# Patient Record
Sex: Male | Born: 1980 | Race: Black or African American | Hispanic: No | Marital: Married | Smoking: Never smoker
Health system: Southern US, Community
[De-identification: ages and names within clinical notes are randomized; demographics above are authoritative.]

## PROBLEM LIST (undated history)

## (undated) DIAGNOSIS — N200 Calculus of kidney: Secondary | ICD-10-CM

## (undated) DIAGNOSIS — T7840XA Allergy, unspecified, initial encounter: Secondary | ICD-10-CM

## (undated) HISTORY — DX: Allergy, unspecified, initial encounter: T78.40XA

---

## 2019-12-17 ENCOUNTER — Other Ambulatory Visit: Payer: Self-pay

## 2019-12-17 ENCOUNTER — Encounter: Payer: Self-pay | Admitting: Emergency Medicine

## 2019-12-17 ENCOUNTER — Ambulatory Visit
Admission: EM | Admit: 2019-12-17 | Discharge: 2019-12-17 | Disposition: A | Discharge status: Home / Self Care | Payer: 59 | Attending: Emergency Medicine | Admitting: Emergency Medicine

## 2019-12-17 DIAGNOSIS — H00015 Hordeolum externum left lower eyelid: Secondary | ICD-10-CM

## 2019-12-17 MED ORDER — OFLOXACIN 0.3 % OP SOLN: 1.0000 [drp] | Freq: Four times a day (QID) | OPHTHALMIC | 0 refills | Status: DC

## 2019-12-17 NOTE — ED Provider Notes (Signed)
[facility]   [id number] 12/17/19 Arrival Time: 1133  CC: Red eye  SUBJECTIVE:  Joseph Lambert is a 39 y.o. male who presented to the urgent care for complaint of started to left eye.  Denies a precipitating event, trauma, or close contacts with similar symptoms.  Has tried OTC eye drops without relief.  Denies alleviating or aggravating factors.  Denies similar symptoms in the past.  Denies fever, chills, nausea, vomiting, eye pain, painful eye movements, halos, discharge, itching, vision changes, double vision, FB sensation, periorbital erythema.     Report contact lens use.    ROS: As per HPI.  All other pertinent ROS negative.     History reviewed. No pertinent past medical history. History reviewed. No pertinent surgical history. Not on File No current facility-administered medications on file prior to encounter.   No current outpatient medications on file prior to encounter.   Social History   Socioeconomic History  . Marital status: Single    Spouse name: Not on file  . Number of children: Not on file  . Years of education: Not on file  . Highest education level: Not on file  Occupational History  . Not on file  Tobacco Use  . Smoking status: Not on file  Substance and Sexual Activity  . Alcohol use: Not on file  . Drug use: Not on file  . Sexual activity: Not on file  Other Topics Concern  . Not on file  Social History Narrative  . Not on file   Social Determinants of Health   Financial Resource Strain:   . Difficulty of Paying Living Expenses: Not on file  Food Insecurity:   . Worried About Programme researcher, broadcasting/film/video in the Last Year: Not on file  . Ran Out of Food in the Last Year: Not on file  Transportation Needs:   . Lack of Transportation (Medical): Not on file  . Lack of Transportation (Non-Medical): Not on file  Physical Activity:   . Days of Exercise per Week: Not on file  . Minutes of Exercise per Session: Not on file  Stress:   . Feeling  of Stress : Not on file  Social Connections:   . Frequency of Communication with Friends and Family: Not on file  . Frequency of Social Gatherings with Friends and Family: Not on file  . Attends Religious Services: Not on file  . Active Member of Clubs or Organizations: Not on file  . Attends Banker Meetings: Not on file  . Marital Status: Not on file  Intimate Partner Violence:   . Fear of Current or Ex-Partner: Not on file  . Emotionally Abused: Not on file  . Physically Abused: Not on file  . Sexually Abused: Not on file   History reviewed. No pertinent family history.  OBJECTIVE:    Visual Acuity  Right Eye Distance:   Left Eye Distance:   Bilateral Distance:    Right Eye Near:   Left Eye Near:    Bilateral Near:      Vitals:   12/17/19 1206  BP: (!) 154/106  Pulse: 79  Resp: 16  Temp: 98.3 F (36.8 C)  TempSrc: Oral  SpO2: 96%    Physical Exam Vitals and nursing note reviewed.  Constitutional:      General: He is not in acute distress.    Appearance: Normal appearance. He is normal weight. He is not ill-appearing, toxic-appearing or diaphoretic.  Eyes:     General: Lids are  normal. Lids are everted, no foreign bodies appreciated. Vision grossly intact. Gaze aligned appropriately. No visual field deficit.       Right eye: No foreign body, discharge or hordeolum.        Left eye: Hordeolum present.No foreign body or discharge.     Comments: Stye  to left lower eyelid  Cardiovascular:     Rate and Rhythm: Normal rate and regular rhythm.     Pulses: Normal pulses.     Heart sounds: Normal heart sounds. No murmur heard.  No friction rub. No gallop.   Pulmonary:     Effort: Pulmonary effort is normal. No respiratory distress.     Breath sounds: Normal breath sounds. No stridor. No wheezing, rhonchi or rales.  Chest:     Chest wall: No tenderness.  Neurological:     Mental Status: He is alert and oriented to person, place, and time.        ASSESSMENT & PLAN:  1. Hordeolum externum of left lower eyelid     Meds ordered this encounter  Medications  . ofloxacin (OCUFLOX) 0.3 % ophthalmic solution    Sig: Place 1 drop into the left eye 4 (four) times daily.    Dispense:  5 mL    Refill:  0     Discharge Instructions  Continue warm compresses at home.  Soak a wash cloth in warm (not scalding) water and place it over the eyes. As the wash cloth cools, it should be rewarmed and replaced for a total of 5 to 10 minutes of soaking time. Warm compresses should be applied two to four times a day as long as the patient has symptoms Perform lid washing: Either warm water or very dilute baby shampoo can be placed on a clean wash cloth, gauze pad, or cotton swab. Then be advised to gently clean along the lashes and lid margin to remove the accumulated material with care to avoid contacting the ocular surface. If shampoo is used, thorough rinsing is recommended. Vigorous washing should be avoided, as it may cause more irritation.  Prescribed ofloxacin Follow up with ophthalmology for further evaluation and management if symptoms persists Return or go to ER if you have any new or worsening symptoms such as fever, chills, redness, swelling, eye pain, painful eye movements, vision changes, etc...  Reviewed expectations re: course of current medical issues. Questions answered. Outlined signs and symptoms indicating need for more acute intervention. Patient verbalized understanding. After Visit Summary given.     Note: This document was prepared using Dragon voice recognition software and may include unintentional dictation errors.    Durward Parcel, FNP 12/17/19 1219

## 2019-12-17 NOTE — ED Triage Notes (Signed)
Triaged by provider  

## 2019-12-17 NOTE — Discharge Instructions (Addendum)
Continue warm compresses at home.  Soak a wash cloth in warm (not scalding) water and place it over the eyes. As the wash cloth cools, it should be rewarmed and replaced for a total of 5 to 10 minutes of soaking time. Warm compresses should be applied two to four times a day as long as the patient has symptoms Perform lid washing: Either warm water or very dilute baby shampoo can be placed on a clean wash cloth, gauze pad, or cotton swab. Then be advised to gently clean along the lashes and lid margin to remove the accumulated material with care to avoid contacting the ocular surface. If shampoo is used, thorough rinsing is recommended. Vigorous washing should be avoided, as it may cause more irritation.  Prescribed ofloxacin Follow up with ophthalmology for further evaluation and management if symptoms persists Return or go to ER if you have any new or worsening symptoms such as fever, chills, redness, swelling, eye pain, painful eye movements, vision changes, etc... 

## 2020-02-08 ENCOUNTER — Encounter (HOSPITAL_COMMUNITY): Payer: Self-pay | Admitting: *Deleted

## 2020-02-08 ENCOUNTER — Other Ambulatory Visit: Payer: Self-pay

## 2020-02-08 DIAGNOSIS — N179 Acute kidney failure, unspecified: Secondary | ICD-10-CM | POA: Diagnosis not present

## 2020-02-08 DIAGNOSIS — Z87442 Personal history of urinary calculi: Secondary | ICD-10-CM | POA: Insufficient documentation

## 2020-02-08 DIAGNOSIS — R109 Unspecified abdominal pain: Secondary | ICD-10-CM | POA: Diagnosis present

## 2020-02-08 NOTE — ED Triage Notes (Signed)
Pt with left flank pain that started today, denies N/V or blood in urine. Hx of kidney stones 10 + yrs ago.

## 2020-02-09 ENCOUNTER — Emergency Department (HOSPITAL_COMMUNITY): Payer: 59

## 2020-02-09 ENCOUNTER — Emergency Department (HOSPITAL_COMMUNITY)
Admission: EM | Admit: 2020-02-09 | Discharge: 2020-02-09 | Disposition: A | Discharge status: Home / Self Care | Payer: 59 | Attending: Emergency Medicine | Admitting: Emergency Medicine

## 2020-02-09 DIAGNOSIS — N179 Acute kidney failure, unspecified: Secondary | ICD-10-CM

## 2020-02-09 DIAGNOSIS — R109 Unspecified abdominal pain: Secondary | ICD-10-CM

## 2020-02-09 HISTORY — DX: Calculus of kidney: N20.0

## 2020-02-09 LAB — URINALYSIS, ROUTINE W REFLEX MICROSCOPIC
Bilirubin Urine: NEGATIVE
Glucose, UA: NEGATIVE mg/dL
Ketones, ur: NEGATIVE mg/dL
Leukocytes,Ua: NEGATIVE
Nitrite: NEGATIVE
Protein, ur: NEGATIVE mg/dL
Specific Gravity, Urine: 1.025 (ref 1.005–1.030)
pH: 6.5 (ref 5.0–8.0)

## 2020-02-09 LAB — BASIC METABOLIC PANEL
Anion gap: 6 (ref 5–15)
BUN: 17 mg/dL (ref 6–20)
CO2: 26 mmol/L (ref 22–32)
Calcium: 9.2 mg/dL (ref 8.9–10.3)
Chloride: 106 mmol/L (ref 98–111)
Creatinine, Ser: 1.42 mg/dL — ABNORMAL HIGH (ref 0.61–1.24)
GFR, Estimated: >60 mL/min (ref >60)
Glucose, Bld: 105 mg/dL — ABNORMAL HIGH (ref 70–99)
Potassium: 3.9 mmol/L (ref 3.5–5.1)
Sodium: 138 mmol/L (ref 135–145)

## 2020-02-09 LAB — URINALYSIS, MICROSCOPIC (REFLEX)

## 2020-02-09 LAB — CBC WITH DIFFERENTIAL/PLATELET
Abs Immature Granulocytes: 0.04 10*3/uL (ref 0.00–0.07)
Basophils Absolute: 0.1 10*3/uL (ref 0.0–0.1)
Basophils Relative: 1 %
Eosinophils Absolute: 0.3 10*3/uL (ref 0.0–0.5)
Eosinophils Relative: 2 %
HCT: 49.8 % (ref 39.0–52.0)
Hemoglobin: 15.6 g/dL (ref 13.0–17.0)
Immature Granulocytes: 0 %
Lymphocytes Relative: 25 %
Lymphs Abs: 2.9 10*3/uL (ref 0.7–4.0)
MCH: 26.3 pg (ref 26.0–34.0)
MCHC: 31.3 g/dL (ref 30.0–36.0)
MCV: 84 fL (ref 80.0–100.0)
Monocytes Absolute: 1.5 10*3/uL — ABNORMAL HIGH (ref 0.1–1.0)
Monocytes Relative: 13 %
Neutro Abs: 6.9 10*3/uL (ref 1.7–7.7)
Neutrophils Relative %: 59 %
Platelets: 320 10*3/uL (ref 150–400)
RBC: 5.93 MIL/uL — ABNORMAL HIGH (ref 4.22–5.81)
RDW: 14.3 % (ref 11.5–15.5)
WBC: 11.7 10*3/uL — ABNORMAL HIGH (ref 4.0–10.5)
nRBC: 0 % (ref 0.0–0.2)

## 2020-02-09 MED ORDER — KETOROLAC TROMETHAMINE 30 MG/ML IJ SOLN
30.0000 mg | Freq: Once | INTRAMUSCULAR | Status: AC
  Administered 2020-02-09: 30 mg via INTRAMUSCULAR
  Filled 2020-02-09: qty 1
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
## 2020-02-09 NOTE — ED Notes (Signed)
Patient transported to CT 

## 2020-02-09 NOTE — Discharge Instructions (Signed)
You were seen in the emergency department for evaluation of left flank pain.  You had blood work and a urinalysis that did not show an obvious explanation for your pain.  Your kidney function was slightly elevated and you should drink plenty of fluids.  A CT showed some inflammation around the left kidney and the tube going to the bladder.  This may reflect a recently passed kidney stone.  Return to the emergency department if any worsening or concerning symptoms.

## 2020-08-20 ENCOUNTER — Other Ambulatory Visit: Payer: Self-pay

## 2020-08-20 ENCOUNTER — Encounter (HOSPITAL_COMMUNITY): Payer: Self-pay | Admitting: *Deleted

## 2020-08-20 ENCOUNTER — Emergency Department (HOSPITAL_COMMUNITY): Payer: 59

## 2020-08-20 ENCOUNTER — Emergency Department (HOSPITAL_COMMUNITY)
Admission: EM | Admit: 2020-08-20 | Discharge: 2020-08-20 | Disposition: A | Discharge status: Home / Self Care | Payer: 59 | Attending: Emergency Medicine | Admitting: Emergency Medicine

## 2020-08-20 DIAGNOSIS — N200 Calculus of kidney: Secondary | ICD-10-CM

## 2020-08-20 DIAGNOSIS — R1032 Left lower quadrant pain: Secondary | ICD-10-CM | POA: Diagnosis present

## 2020-08-20 DIAGNOSIS — K5900 Constipation, unspecified: Secondary | ICD-10-CM | POA: Insufficient documentation

## 2020-08-20 DIAGNOSIS — R197 Diarrhea, unspecified: Secondary | ICD-10-CM | POA: Insufficient documentation

## 2020-08-20 LAB — CBC WITH DIFFERENTIAL/PLATELET
Abs Immature Granulocytes: 0.04 10*3/uL (ref 0.00–0.07)
Basophils Absolute: 0.1 10*3/uL (ref 0.0–0.1)
Basophils Relative: 1 %
Eosinophils Absolute: 0.1 10*3/uL (ref 0.0–0.5)
Eosinophils Relative: 1 %
HCT: 48.1 % (ref 39.0–52.0)
Hemoglobin: 15.2 g/dL (ref 13.0–17.0)
Immature Granulocytes: 0 %
Lymphocytes Relative: 19 %
Lymphs Abs: 1.8 10*3/uL (ref 0.7–4.0)
MCH: 26.3 pg (ref 26.0–34.0)
MCHC: 31.6 g/dL (ref 30.0–36.0)
MCV: 83.4 fL (ref 80.0–100.0)
Monocytes Absolute: 1 10*3/uL (ref 0.1–1.0)
Monocytes Relative: 11 %
Neutro Abs: 6.2 10*3/uL (ref 1.7–7.7)
Neutrophils Relative %: 68 %
Platelets: 307 10*3/uL (ref 150–400)
RBC: 5.77 MIL/uL (ref 4.22–5.81)
RDW: 14.3 % (ref 11.5–15.5)
WBC: 9.1 10*3/uL (ref 4.0–10.5)
nRBC: 0 % (ref 0.0–0.2)

## 2020-08-20 LAB — COMPREHENSIVE METABOLIC PANEL
ALT: 35 U/L (ref 0–44)
AST: 22 U/L (ref 15–41)
Albumin: 4 g/dL (ref 3.5–5.0)
Alkaline Phosphatase: 72 U/L (ref 38–126)
Anion gap: 6 (ref 5–15)
BUN: 14 mg/dL (ref 6–20)
CO2: 28 mmol/L (ref 22–32)
Calcium: 9.2 mg/dL (ref 8.9–10.3)
Chloride: 104 mmol/L (ref 98–111)
Creatinine, Ser: 1.69 mg/dL — ABNORMAL HIGH (ref 0.61–1.24)
GFR, Estimated: 53 mL/min — ABNORMAL LOW (ref >60)
Glucose, Bld: 95 mg/dL (ref 70–99)
Potassium: 4 mmol/L (ref 3.5–5.1)
Sodium: 138 mmol/L (ref 135–145)
Total Bilirubin: 0.4 mg/dL (ref 0.3–1.2)
Total Protein: 7.4 g/dL (ref 6.5–8.1)

## 2020-08-20 LAB — URINALYSIS, ROUTINE W REFLEX MICROSCOPIC
Bilirubin Urine: NEGATIVE
Glucose, UA: NEGATIVE mg/dL
Hgb urine dipstick: NEGATIVE
Ketones, ur: NEGATIVE mg/dL
Leukocytes,Ua: NEGATIVE
Nitrite: NEGATIVE
Protein, ur: NEGATIVE mg/dL
Specific Gravity, Urine: 1.021 (ref 1.005–1.030)
pH: 7 (ref 5.0–8.0)

## 2020-08-20 LAB — LIPASE, BLOOD: Lipase: 22 U/L (ref 11–51)

## 2020-08-20 MED ORDER — MORPHINE SULFATE (PF) 4 MG/ML IV SOLN
4.0000 mg | Freq: Once | INTRAVENOUS | Status: AC
  Administered 2020-08-20: 4 mg via INTRAVENOUS
  Filled 2020-08-20: qty 1

## 2020-08-20 MED ORDER — IOHEXOL 300 MG/ML  SOLN
100.0000 mL | Freq: Once | INTRAMUSCULAR | Status: AC | PRN
  Administered 2020-08-20: 100 mL via INTRAVENOUS

## 2020-08-20 MED ORDER — ONDANSETRON HCL 4 MG PO TABS: 4.0000 mg | ORAL_TABLET | Freq: Three times a day (TID) | ORAL | 0 refills | Status: DC | PRN

## 2020-08-20 MED ORDER — OXYCODONE-ACETAMINOPHEN 5-325 MG PO TABS: 1.0000 | ORAL_TABLET | Freq: Three times a day (TID) | ORAL | 0 refills | Status: AC | PRN

## 2020-08-20 NOTE — ED Triage Notes (Signed)
Pt with abd pain x 2 hours LLQ pain Denies any N/V/D denies any fevers.

## 2020-08-20 NOTE — ED Provider Notes (Signed)
[facility] EMERGENCY DEPARTMENT Provider Note   CSN: [id number] Arrival date & time: 08/20/20  1619     History Chief Complaint  Patient presents with  . Abdominal Pain    Joseph Lambert is a 40 y.o. male.  HPI   Patient with significant medical history of kidney stones presents to the emergency department with chief complaint of left lower abdominal pain.  Patient states pain came on suddenly, describes it as a sharp sensation, does not radiate, with associated difficulty urination, denies nausea, vomiting, diarrhea, constipation, denies dysuria, hematuria, penile discharge or testicular pain.  Patient states this feels similar to when he had a kidney stone in the past, he has taken Tylenol with some relief.  States standing  makes the pain  better, denies aggravating factors.   Patient has no significant abdominal history, no history of pancreatitis, diverticulitis, bowel obstruction, stomach ulcers.  Patient denies headaches, fevers, chills, shortness of breath, chest pain, nausea, vomiting, diarrhea, worsening pedal edema.  Past Medical History:  Diagnosis Date  . Kidney stones     There are no problems to display for this patient.   History reviewed. No pertinent surgical history.     History reviewed. No pertinent family history.  Social History   Tobacco Use  . Smoking status: Never Smoker  . Smokeless tobacco: Never Used  Substance Use Topics  . Alcohol use: Yes  . Drug use: Never    Home Medications Prior to Admission medications   Medication Sig Start Date End Date Taking? Authorizing Provider  cetirizine (ZYRTEC ALLERGY) 10 MG tablet Take 10 mg by mouth daily.   Yes [provider]  ibuprofen (ADVIL) 200 MG tablet Take 600 mg by mouth every 6 (six) hours as needed.   Yes [provider]  ondansetron (ZOFRAN) 4 MG tablet Take 1 tablet (4 mg total) by mouth every 8 (eight) hours as needed for nausea or vomiting. 08/20/20  Yes Carroll Sage, PA-C  ondansetron (ZOFRAN) 4 MG tablet Take 1 tablet (4 mg total) by mouth every 8 (eight) hours as needed for nausea or vomiting. 08/20/20  Yes Carroll Sage, PA-C  oxyCODONE-acetaminophen (PERCOCET/ROXICET) 5-325 MG tablet Take 1 tablet by mouth every 8 (eight) hours as needed for up to 2 days for severe pain. 08/20/20 08/22/20 Yes Carroll Sage, PA-C  ofloxacin (OCUFLOX) 0.3 % ophthalmic solution Place 1 drop into the left eye 4 (four) times daily. Patient not taking: Reported on 08/20/2020 12/17/19   Durward Parcel, FNP    Allergies    Penicillins  Review of Systems   Review of Systems  Constitutional: Negative for chills and fever.  HENT: Negative for congestion and sore throat.   Respiratory: Negative for shortness of breath.   Cardiovascular: Negative for chest pain.  Gastrointestinal: Positive for abdominal pain. Negative for diarrhea, nausea and vomiting.  Genitourinary: Positive for difficulty urinating. Negative for decreased urine volume, dysuria, enuresis, flank pain, hematuria, penile discharge and penile pain.  Musculoskeletal: Negative for back pain.  Skin: Negative for rash.  Neurological: Negative for headaches.  Hematological: Does not bruise/bleed easily.    Physical Exam Updated Vital Signs BP (!) 139/109   Pulse 94   Temp 98.3 F (36.8 C) (Oral)   Resp 18   SpO2 99%   Physical Exam Vitals and nursing note reviewed.  Constitutional:      General: He is not in acute distress.    Appearance: He is not ill-appearing.  HENT:  Head: Normocephalic and atraumatic.     Nose: No congestion.  Eyes:     Conjunctiva/sclera: Conjunctivae normal.  Cardiovascular:     Rate and Rhythm: Normal rate and regular rhythm.     Pulses: Normal pulses.     Heart sounds: No murmur heard. No friction rub. No gallop.   Pulmonary:     Effort: No respiratory distress.     Breath sounds: No wheezing, rhonchi or rales.  Abdominal:     General: There is no  distension.     Palpations: Abdomen is soft.     Tenderness: There is abdominal tenderness. There is no right CVA tenderness or left CVA tenderness.     Comments: Abdomen was visualized, nondistended, normoactive bowel sounds, dull to percussion.  He has noted tenderness in his left lower quadrant, no guarding, rebound tenderness, peritoneal sign, negative Murphy sign or McBurney point, no CVA tenderness  Musculoskeletal:     Right lower leg: No edema.     Left lower leg: No edema.  Skin:    General: Skin is warm and dry.  Neurological:     Mental Status: He is alert.  Psychiatric:        Mood and Affect: Mood normal.     ED Results / Procedures / Treatments   Labs (all labs ordered are listed, but only abnormal results are displayed) Labs Reviewed  COMPREHENSIVE METABOLIC PANEL - Abnormal; Notable for the following components:      Result Value   Creatinine, Ser 1.69 (*)    GFR, Estimated 53 (*)    All other components within normal limits  LIPASE, BLOOD  CBC WITH DIFFERENTIAL/PLATELET  URINALYSIS, ROUTINE W REFLEX MICROSCOPIC    EKG None  Radiology CT ABDOMEN PELVIS W CONTRAST  Result Date: 08/20/2020 CLINICAL DATA:  Left lower quadrant pain for 2 hours EXAM: CT ABDOMEN AND PELVIS WITH CONTRAST TECHNIQUE: Multidetector CT imaging of the abdomen and pelvis was performed using the standard protocol following bolus administration of intravenous contrast. CONTRAST:  OMNIPAQUE IOHEXOL 300 MG/ML  SOLN COMPARISON:  02/09/2020 FINDINGS: Lower chest: No acute pleural or parenchymal lung disease. Hepatobiliary: No focal liver abnormality is seen. No gallstones, gallbladder wall thickening, or biliary dilatation. Pancreas: Unremarkable. No pancreatic ductal dilatation or surrounding inflammatory changes. Spleen: Normal in size without focal abnormality. Adrenals/Urinary Tract: There is mild to moderate left-sided obstructive uropathy due to a 2 mm obstructing left UVJ calculus,  reference image 70/2. There is left renal edema with delayed nephrogram. Perinephric fat stranding may reflect superinfection. The right kidney enhances normally. The adrenals and bladder are unremarkable. Stomach/Bowel: No bowel obstruction or ileus. Normal appendix right lower quadrant. No bowel wall thickening or inflammatory change. Vascular/Lymphatic: No significant vascular findings are present. No enlarged abdominal or pelvic lymph nodes. Reproductive: Prostate is unremarkable. Other: No free fluid or free intraperitoneal gas. No abdominal wall hernia. Musculoskeletal: No acute or destructive bony lesions. Reconstructed images demonstrate no additional findings. IMPRESSION: 1. Left-sided obstructive uropathy due to a 2 mm obstructing left UVJ calculus. Perinephric fat stranding of the left kidney could reflect superinfection. Please correlate with urinalysis. Electronically Signed   By: Sharlet Salina M.D.   On: 08/20/2020 18:32    Procedures Procedures   Medications Ordered in ED Medications  morphine 4 MG/ML injection 4 mg (4 mg Intravenous Given 08/20/20 1757)  iohexol (OMNIPAQUE) 300 MG/ML solution 100 mL (100 mLs Intravenous Contrast Given 08/20/20 1820)    ED Course  I have reviewed the  triage vital signs and the nursing notes.  Pertinent labs & imaging results that were available during my care of the patient were reviewed by me and considered in my medical decision making (see chart for details).    MDM Rules/Calculators/A&P                         Initial impression-patient presents with left lower quadrant pain.  He is alert, does not appear to be in acute distress, vital signs reassuring.  Concern for possible kidney stone versus UTI versus Pilo versus diverticulitis.  Will obtain basic lab work-up continue evaluate  Work-up-CBC unremarkable, CMP shows slight elevation creatinine of 169 from baseline, UA unremarkable, lipase 22, CT abdomen pelvis shows left-sided obstruction  uropathy due to a 2 mm obstructing left stone at the UVJ, peripharyngeal fat stranding of the left kidney could reflect superficial infection.  Reassessment informed that the patient is in 8 out of 10 pain, will provide him with narcotics and reassess.  Patient was reassessed after providing him with morphine, states he is feeling much better, has no complaints this time.  Updated him on lab work and imaging, patient is agreed for discharge at this time.  Rule out-I have low suspicion for systemic infection as patient is nontoxic-appearing, vital signs reassuring, no obvious source of infection on my exam.  Low suspicion for pyelonephritis, UTI as UA is negative for signs of infection.  Low suspicion for infected kidney stone as UA is negative.  Imaging does show pericolonic stranding of the left kidney I suspect this secondary due to hydronephrosis and not a infectious etiology as UA is unremarkable, no leukocytosis seen on CBC.  Low suspicion for diverticulitis or abdominal abscess as patient has no white count, vital signs reassuring, imaging negative for acute findings.  Plan-  1.  Left lower quadrant pain since resolved-suspect secondary due to 2 mm renal calculus at the UVJ, will provide patient with antiemetics pain medications follow-up with urology for further evaluation.  Vital signs have remained stable, no indication for hospital admission.    Patient given at home care as well strict return precautions.  Patient verbalized that they understood agreed to said plan.   Final Clinical Impression(s) / ED Diagnoses Final diagnoses:  Left lower quadrant abdominal pain  Kidney stone    Rx / DC Orders ED Discharge Orders         Ordered    oxyCODONE-acetaminophen (PERCOCET/ROXICET) 5-325 MG tablet  Every 8 hours PRN        08/20/20 1906    ondansetron (ZOFRAN) 4 MG tablet  Every 8 hours PRN        08/20/20 1906    ondansetron (ZOFRAN) 4 MG tablet  Every 8 hours PRN        08/20/20  1907           Barnie Del 08/20/20 1910    Long, Arlyss Repress, MD 08/20/20 2002

## 2020-08-20 NOTE — Discharge Instructions (Addendum)
You have a kidney stone, I have given you a prescription for narcotics please take as needed for pain.  Please be aware this medication can make you drowsy do not consume alcohol or operate heavy machinery while taking this medication.  This medication also has Tylenol in it do not take Tylenol while taking this medication.  Aslo given you Zofran please as needed for nausea.  You may take over-the-counter ibuprofen as needed please follow dosing back of bottle.  Please follow-up with urology for further evaluation.  Come back to the emergency department if you develop chest pain, shortness of breath, severe abdominal pain, uncontrolled nausea, vomiting, diarrhea.

## 2020-08-21 MED FILL — Oxycodone w/ Acetaminophen Tab 5-325 MG: ORAL | Qty: 6 | Status: AC

## 2021-03-07 ENCOUNTER — Ambulatory Visit (INDEPENDENT_AMBULATORY_CARE_PROVIDER_SITE_OTHER): Payer: 59 | Admitting: Nurse Practitioner

## 2021-03-07 ENCOUNTER — Encounter: Payer: Self-pay | Admitting: Nurse Practitioner

## 2021-03-07 ENCOUNTER — Other Ambulatory Visit: Payer: Self-pay

## 2021-03-07 VITALS — BP 138/82 | HR 96 | Temp 97.7°F | Ht 65.25 in | Wt 179.0 lb

## 2021-03-07 DIAGNOSIS — R030 Elevated blood-pressure reading, without diagnosis of hypertension: Secondary | ICD-10-CM | POA: Diagnosis not present

## 2021-03-07 NOTE — Assessment & Plan Note (Addendum)
Elevated BP during dental appt. Use wrist cuff: 168/105 and 156/103. Asymptomatic. FHx of HTN (PGF and MGF). CKD (unknown cause): maternal aunt. CVA: paternal uncle. No tobacco use, no ETOH use. No illicit drug use No hx of snoring or OSA. No AM headache or daytime somnolence, no LE edema Use of CBD product without THC 3x/week. Exercise: cardio and weight training 3x/week  Monitor BP at home 2x/week in AM. Call office if BP >140/90 Maintain DASH diet. Contain exercise regimen F/up in 29month for CPE (fasting)

## 2021-03-07 NOTE — Patient Instructions (Addendum)
Monitor BP at home 2x/week in AM. Call office if BP >140/90 Maintain DASH diet. Thank you for choosing [facility]  DASH Eating Plan DASH stands for Dietary Approaches to Stop Hypertension. The DASH eating plan is a healthy eating plan that has been shown to: Reduce high blood pressure (hypertension). Reduce your risk for type 2 diabetes, heart disease, and stroke. Help with weight loss. What are tips for following this plan? Reading food labels Check food labels for the amount of salt (sodium) per serving. Choose foods with less than 5 percent of the Daily Value of sodium. Generally, foods with less than 300 milligrams (mg) of sodium per serving fit into this eating plan. To find whole grains, look for the word "whole" as the first word in the ingredient list. Shopping Buy products labeled as "low-sodium" or "no salt added." Buy fresh foods. Avoid canned foods and pre-made or frozen meals. Cooking Avoid adding salt when cooking. Use salt-free seasonings or herbs instead of table salt or sea salt. Check with your health care provider or pharmacist before using salt substitutes. Do not fry foods. Cook foods using healthy methods such as baking, boiling, grilling, roasting, and broiling instead. Cook with heart-healthy oils, such as olive, canola, avocado, soybean, or sunflower oil. Meal planning  Eat a balanced diet that includes: 4 or more servings of fruits and 4 or more servings of vegetables each day. Try to fill one-half of your plate with fruits and vegetables. 6-8 servings of whole grains each day. Less than 6 oz (170 g) of lean meat, poultry, or fish each day. A 3-oz (85-g) serving of meat is about the same size as a deck of cards. One egg equals 1 oz (28 g). 2-3 servings of low-fat dairy each day. One serving is 1 cup (237 mL). 1 serving of nuts, seeds, or beans 5 times each week. 2-3 servings of heart-healthy fats. Healthy fats called omega-3 fatty acids are found in  foods such as walnuts, flaxseeds, fortified milks, and eggs. These fats are also found in cold-water fish, such as sardines, salmon, and mackerel. Limit how much you eat of: Canned or prepackaged foods. Food that is high in trans fat, such as some fried foods. Food that is high in saturated fat, such as fatty meat. Desserts and other sweets, sugary drinks, and other foods with added sugar. Full-fat dairy products. Do not salt foods before eating. Do not eat more than 4 egg yolks a week. Try to eat at least 2 vegetarian meals a week. Eat more home-cooked food and less restaurant, buffet, and fast food. Lifestyle When eating at a restaurant, ask that your food be prepared with less salt or no salt, if possible. If you drink alcohol: Limit how much you use to: 0-1 drink a day for women who are not pregnant. 0-2 drinks a day for men. Be aware of how much alcohol is in your drink. In the [place] one drink equals one 12 oz bottle of beer (355 mL), one 5 oz glass of wine (148 mL), or one 1 oz glass of hard liquor (44 mL). General information Avoid eating more than 2,300 mg of salt a day. If you have hypertension, you may need to reduce your sodium intake to 1,500 mg a day. Work with your health care provider to maintain a healthy body weight or to lose weight. Ask what an ideal weight is for you. Get at least 30 minutes of exercise that causes your heart to beat faster (  aerobic exercise) most days of the week. Activities may include walking, swimming, or biking. Work with your health care provider or dietitian to adjust your eating plan to your individual calorie needs. What foods should I eat? Fruits All fresh, dried, or frozen fruit. Canned fruit in natural juice (without added sugar). Vegetables Fresh or frozen vegetables (raw, steamed, roasted, or grilled). Low-sodium or reduced-sodium tomato and vegetable juice. Low-sodium or reduced-sodium tomato sauce and tomato paste. Low-sodium or  reduced-sodium canned vegetables. Grains Whole-grain or whole-wheat bread. Whole-grain or whole-wheat pasta. Brown rice. [place] Morrow. Bulgur. Whole-grain and low-sodium cereals. Pita bread. Low-fat, low-sodium crackers. Whole-wheat flour tortillas. Meats and other proteins Skinless chicken or [place]. Ground chicken or [place]. Pork with fat trimmed off. Fish and seafood. Egg whites. Dried beans, peas, or lentils. Unsalted nuts, nut butters, and seeds. Unsalted canned beans. Lean cuts of beef with fat trimmed off. Low-sodium, lean precooked or cured meat, such as sausages or meat loaves. Dairy Low-fat (1%) or fat-free (skim) milk. Reduced-fat, low-fat, or fat-free cheeses. Nonfat, low-sodium ricotta or cottage cheese. Low-fat or nonfat yogurt. Low-fat, low-sodium cheese. Fats and oils Soft margarine without trans fats. Vegetable oil. Reduced-fat, low-fat, or light mayonnaise and salad dressings (reduced-sodium). Canola, safflower, olive, avocado, soybean, and sunflower oils. Avocado. Seasonings and condiments Herbs. Spices. Seasoning mixes without salt. Other foods Unsalted popcorn and pretzels. Fat-free sweets. The items listed above may not be a complete list of foods and beverages you can eat. Contact a dietitian for more information. What foods should I avoid? Fruits Canned fruit in a light or heavy syrup. Fried fruit. Fruit in cream or butter sauce. Vegetables Creamed or fried vegetables. Vegetables in a cheese sauce. Regular canned vegetables (not low-sodium or reduced-sodium). Regular canned tomato sauce and paste (not low-sodium or reduced-sodium). Regular tomato and vegetable juice (not low-sodium or reduced-sodium). Angie Fava. Olives. Grains Baked goods made with fat, such as croissants, muffins, or some breads. Dry pasta or rice meal packs. Meats and other proteins Fatty cuts of meat. Ribs. Fried meat. Berniece Salines. Bologna, salami, and other precooked or cured meats, such as sausages or  meat loaves. Fat from the back of a pig (fatback). Bratwurst. Salted nuts and seeds. Canned beans with added salt. Canned or smoked fish. Whole eggs or egg yolks. Chicken or [place] with skin. Dairy Whole or 2% milk, cream, and half-and-half. Whole or full-fat cream cheese. Whole-fat or sweetened yogurt. Full-fat cheese. Nondairy creamers. Whipped toppings. Processed cheese and cheese spreads. Fats and oils Butter. Stick margarine. Lard. Shortening. Ghee. Bacon fat. Tropical oils, such as coconut, palm kernel, or palm oil. Seasonings and condiments Onion salt, garlic salt, seasoned salt, table salt, and sea salt. Worcestershire sauce. Tartar sauce. Barbecue sauce. Teriyaki sauce. Soy sauce, including reduced-sodium. Steak sauce. Canned and packaged gravies. Fish sauce. Oyster sauce. Cocktail sauce. Store-bought horseradish. Ketchup. Mustard. Meat flavorings and tenderizers. Bouillon cubes. Hot sauces. Pre-made or packaged marinades. Pre-made or packaged taco seasonings. Relishes. Regular salad dressings. Other foods Salted popcorn and pretzels. The items listed above may not be a complete list of foods and beverages you should avoid. Contact a dietitian for more information. Where to find more information National Heart, Lung, and Blood Institute: https://wilson-eaton.com/ American Heart Association: www.heart.org Academy of Nutrition and Dietetics: www.eatright.[place]: www.kidney.org Summary The DASH eating plan is a healthy eating plan that has been shown to reduce high blood pressure (hypertension). It may also reduce your risk for type 2 diabetes, heart disease, and stroke. When on the  DASH eating plan, aim to eat more fresh fruits and vegetables, whole grains, lean proteins, low-fat dairy, and heart-healthy fats. With the DASH eating plan, you should limit salt (sodium) intake to 2,300 mg a day. If you have hypertension, you may need to reduce your sodium intake to 1,500 mg a  day. Work with your health care provider or dietitian to adjust your eating plan to your individual calorie needs. This information is not intended to replace advice given to you by your health care provider. Make sure you discuss any questions you have with your health care provider. Document Revised: 03/12/2019 Document Reviewed: 03/12/2019 Elsevier Patient Education  2022 [employer].

## 2021-03-07 NOTE — Progress Notes (Deleted)
   Subjective:  Patient ID: Joseph Lambert, male    DOB: [birth year]  Age: 40 y.o. MRN: [id number]  CC: Establish Care (New patient/ Pt states he has been experiencing elevated BP when going to the dentist and unable to get his dental procedures due to this. Both times it was done at the dentist it has been 168/105 and 156/103./Declines vaccines today. )  HPI Fhx of HTN: PGF and MGF Fhx of CVA: PGF in 60s No CAD/MI CKD with dialysis: M. Aunt Thyroid disease: mother. Lupus: cousin (maternal). ETOH: rarely, 2 beers  No tobacco ue CBD product: 3x/week with no THC. Diet: regular Exercise:  weight lifting 3x/week, HIT traing 2xweek, each. No snoring, no OSA. Up to date with eye exam, no changes.  No problem-specific Assessment & Plan notes found for this encounter.   Reviewed past Medical, Social and Family history today.  Outpatient Medications Prior to Visit  Medication Sig Dispense Refill   cetirizine (ZYRTEC) 10 MG tablet Take 10 mg by mouth daily. (Patient not taking: Reported on 03/07/2021)     ibuprofen (ADVIL) 200 MG tablet Take 600 mg by mouth every 6 (six) hours as needed. (Patient not taking: Reported on 03/07/2021)     ofloxacin (OCUFLOX) 0.3 % ophthalmic solution Place 1 drop into the left eye 4 (four) times daily. (Patient not taking: No sig reported) 5 mL 0   ondansetron (ZOFRAN) 4 MG tablet Take 1 tablet (4 mg total) by mouth every 8 (eight) hours as needed for nausea or vomiting. (Patient not taking: Reported on 03/07/2021) 4 tablet 0   ondansetron (ZOFRAN) 4 MG tablet Take 1 tablet (4 mg total) by mouth every 8 (eight) hours as needed for nausea or vomiting. (Patient not taking: Reported on 03/07/2021) 12 tablet 0   No facility-administered medications prior to visit.    ROS See HPI  Objective:  BP 138/82 (BP Location: Left Arm, Patient Position: Sitting, Cuff Size: Normal)   Pulse 96   Temp 97.7 F (36.5 C) (Temporal)   Ht 5' 5.25" (1.657 m)   Wt 179 lb  (81.2 kg)   SpO2 99%   BMI 29.56 kg/m   Physical Exam  Assessment & Plan:  This visit occurred during the SARS-CoV-2 public health emergency.  Safety protocols were in place, including screening questions prior to the visit, additional usage of staff PPE, and extensive cleaning of exam room while observing appropriate contact time as indicated for disinfecting solutions.   There are no diagnoses linked to this encounter.  Problem List Items Addressed This Visit   None    I have spent ***mins with this patient regarding history taking, documentation, review of *** (labs, radiology, specialty note), formulating plan and discussing treatment options with patient.  Follow-up: No follow-ups on file.  Alysia Penna, NP

## 2021-03-07 NOTE — Progress Notes (Signed)
Subjective:  Patient ID: Joseph Lambert, male    DOB: 1980/06/23  Age: 40 y.o. MRN: [id number]  CC: Establish Care (New patient/ Pt states he has been experiencing elevated BP when going to the dentist and unable to get his dental procedures due to this. Both times it was done at the dentist it has been 168/105 and 156/103./Declines vaccines today. )  HPI  Elevated BP without diagnosis of hypertension Elevated BP during dental appt. Use wrist cuff: 168/105 and 156/103. Asymptomatic. FHx of HTN (PGF and MGF). CKD (unknown cause): maternal aunt. CVA: paternal uncle. No tobacco use, no ETOH use. No illicit drug use No hx of snoring or OSA. No AM headache or daytime somnolence, no LE edema Use of CBD product without THC 3x/week. Exercise: cardio and weight training 3x/week  Monitor BP at home 2x/week in AM. Call office if BP >140/90 Maintain DASH diet. Contain exercise regimen F/up in 30month for CPE (fasting)  Reviewed past Medical, Social and Family history today.  Outpatient Medications Prior to Visit  Medication Sig Dispense Refill   cetirizine (ZYRTEC) 10 MG tablet Take 10 mg by mouth daily. (Patient not taking: Reported on 03/07/2021)     ibuprofen (ADVIL) 200 MG tablet Take 600 mg by mouth every 6 (six) hours as needed. (Patient not taking: Reported on 03/07/2021)     ofloxacin (OCUFLOX) 0.3 % ophthalmic solution Place 1 drop into the left eye 4 (four) times daily. (Patient not taking: No sig reported) 5 mL 0   ondansetron (ZOFRAN) 4 MG tablet Take 1 tablet (4 mg total) by mouth every 8 (eight) hours as needed for nausea or vomiting. (Patient not taking: Reported on 03/07/2021) 4 tablet 0   ondansetron (ZOFRAN) 4 MG tablet Take 1 tablet (4 mg total) by mouth every 8 (eight) hours as needed for nausea or vomiting. (Patient not taking: Reported on 03/07/2021) 12 tablet 0   No facility-administered medications prior to visit.    ROS See HPI  Objective:  BP 138/82 (BP Location:  Left Arm, Patient Position: Sitting, Cuff Size: Normal)   Pulse 96   Temp 97.7 F (36.5 C) (Temporal)   Ht 5' 5.25" (1.657 m)   Wt 179 lb (81.2 kg)   SpO2 99%   BMI 29.56 kg/m   Physical Exam Vitals reviewed.  Cardiovascular:     Rate and Rhythm: Normal rate and regular rhythm.     Pulses: Normal pulses.     Heart sounds: Normal heart sounds.  Pulmonary:     Effort: Pulmonary effort is normal.     Breath sounds: Normal breath sounds.  Musculoskeletal:     Right lower leg: No edema.     Left lower leg: No edema.  Neurological:     Mental Status: He is alert.  Psychiatric:        Mood and Affect: Mood normal.        Behavior: Behavior normal.        Thought Content: Thought content normal.    Assessment & Plan:  This visit occurred during the SARS-CoV-2 public health emergency.  Safety protocols were in place, including screening questions prior to the visit, additional usage of staff PPE, and extensive cleaning of exam room while observing appropriate contact time as indicated for disinfecting solutions.   [name] was seen today for establish care.  Diagnoses and all orders for this visit:  Elevated BP without diagnosis of hypertension  Problem List Items Addressed This Visit  Other   Elevated BP without diagnosis of hypertension - Primary    Elevated BP during dental appt. Use wrist cuff: 168/105 and 156/103. Asymptomatic. FHx of HTN (PGF and MGF). CKD (unknown cause): maternal aunt. CVA: paternal uncle. No tobacco use, no ETOH use. No illicit drug use No hx of snoring or OSA. No AM headache or daytime somnolence, no LE edema Use of CBD product without THC 3x/week. Exercise: cardio and weight training 3x/week  Monitor BP at home 2x/week in AM. Call office if BP >140/90 Maintain DASH diet. Contain exercise regimen F/up in 25month for CPE (fasting)       Follow-up: Return in about 3 months (around 06/07/2021) for CPE (fasting).  Alysia Penna, NP

## 2021-06-07 ENCOUNTER — Encounter: Payer: 59 | Admitting: Nurse Practitioner

## 2021-09-15 IMAGING — CT CT ABD-PELV W/ CM
2 of 4 series · 16 of 46 positions shown, 18 images · IV contrast (Omnipaque or Isovue)
Comparison: 02/09/2020

CLINICAL DATA: Left lower quadrant pain for 2 hours

EXAM:
CT ABDOMEN AND PELVIS WITH CONTRAST
TECHNIQUE: Multidetector CT imaging of the abdomen and pelvis was performed
using the standard protocol following bolus administration of
intravenous contrast.
CONTRAST:  100mL OMNIPAQUE IOHEXOL 300 MG/ML  SOLN

[Series 2: axial st · axial · 0.88mm/px · z∈[+907,+1322]mm · 13 of 93 slices shown, 15 images]
[im 5/93  soft-tissue]
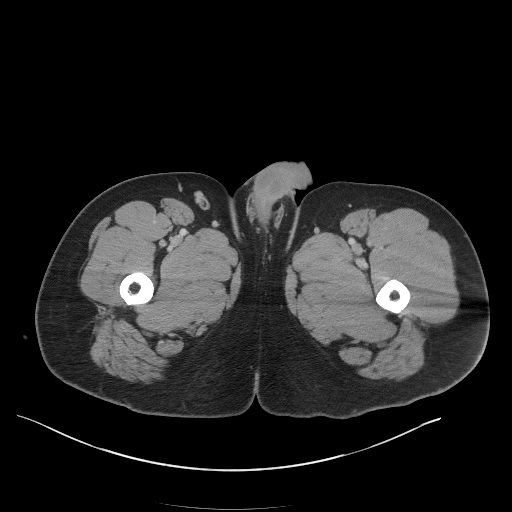
[im 5/93  bone]
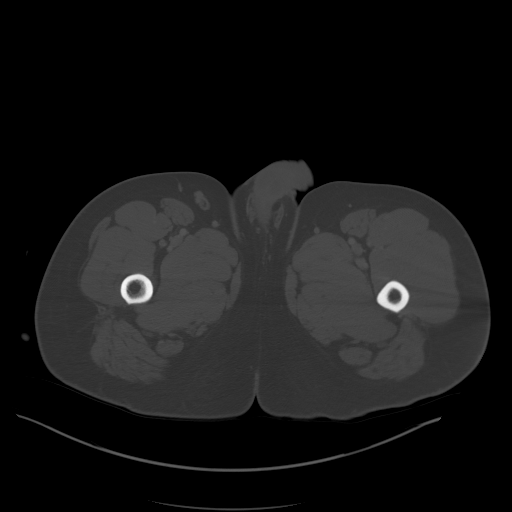
[im 14/93  soft-tissue]
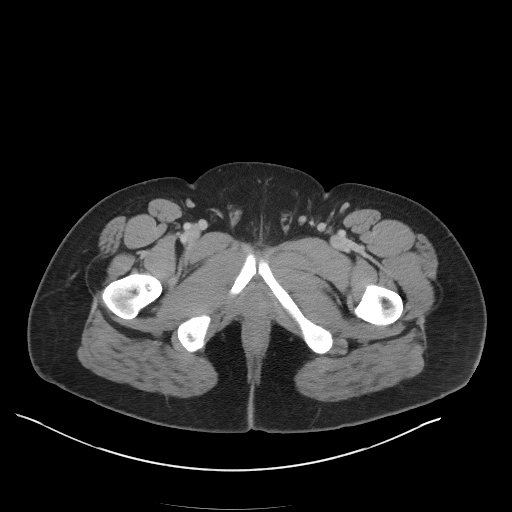
[im 18/93  soft-tissue]
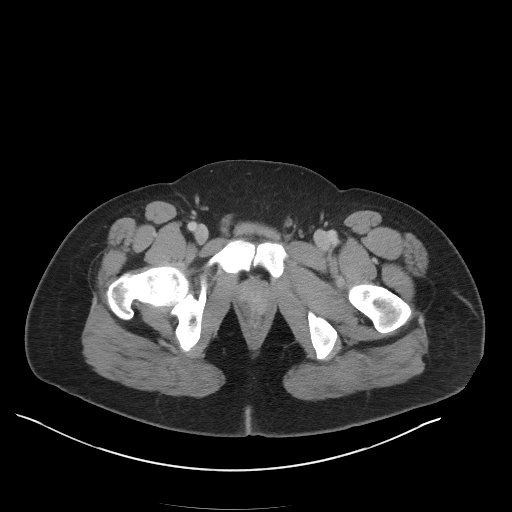
[im 27/93  soft-tissue]
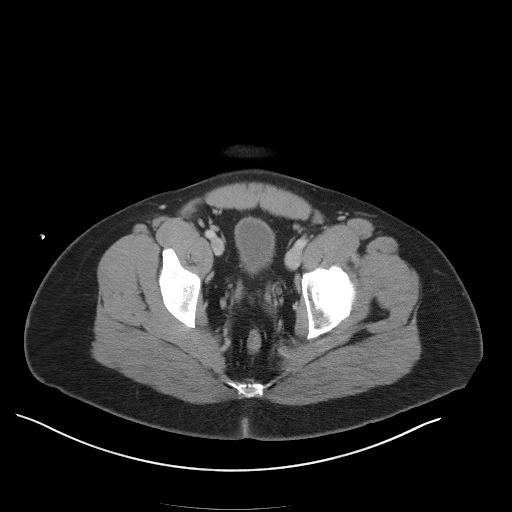
[im 31/93  soft-tissue]
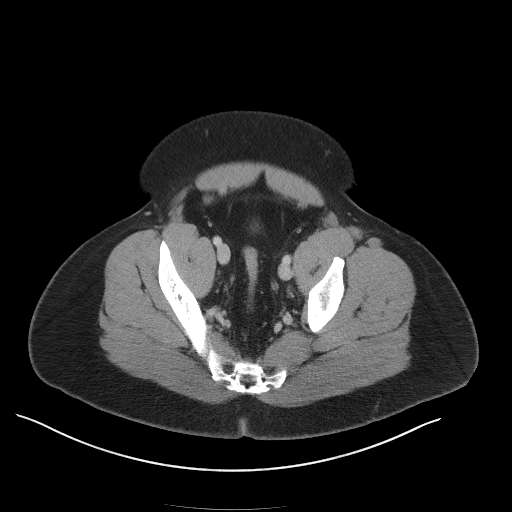
[im 40/93  soft-tissue]
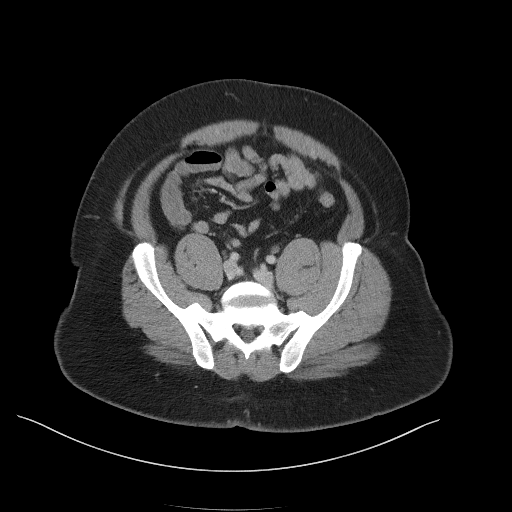
[im 49/93  soft-tissue]
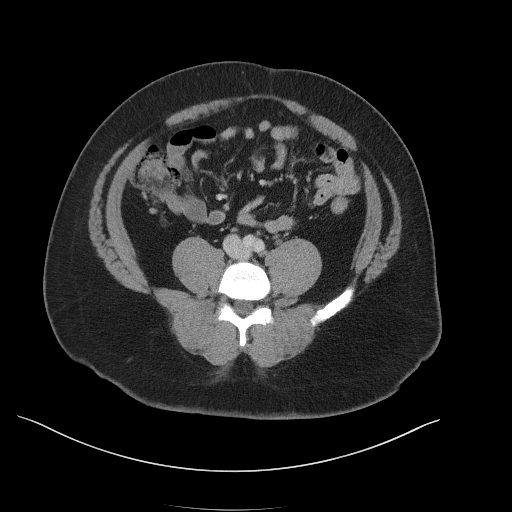
[im 53/93  soft-tissue]
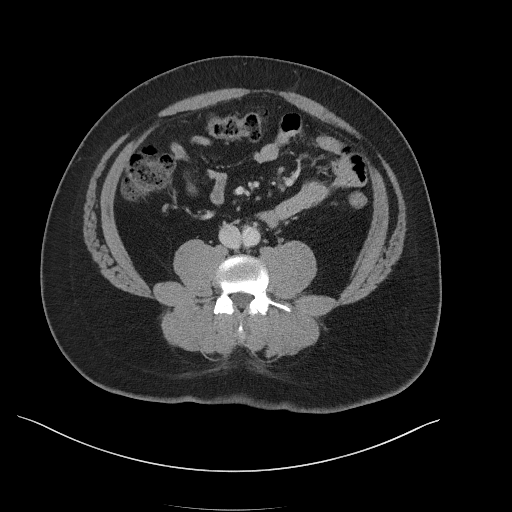
[im 62/93  soft-tissue]
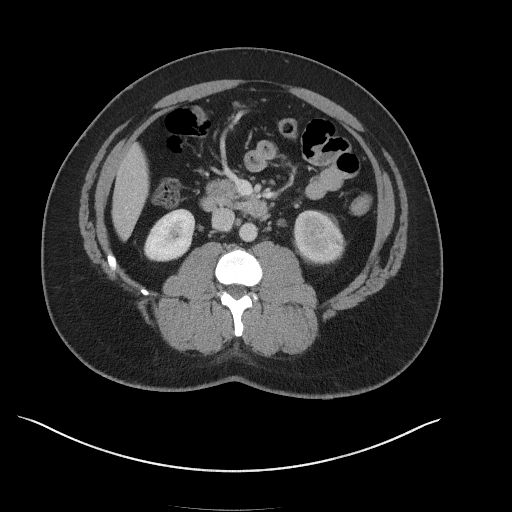
[im 62/93  bone]
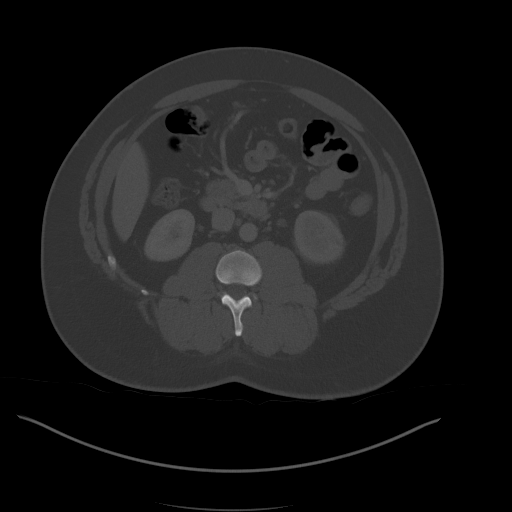
[im 66/93  soft-tissue]
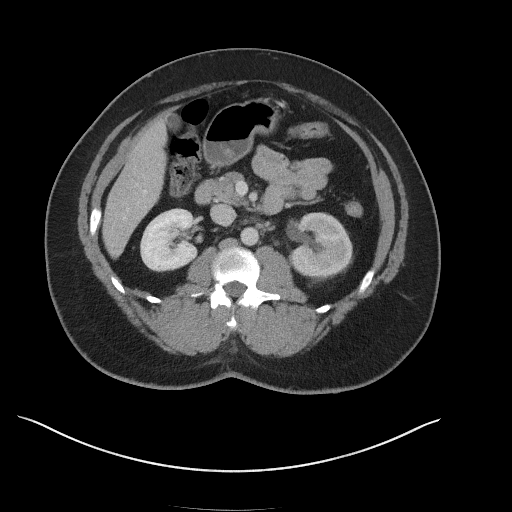
[im 75/93  soft-tissue]
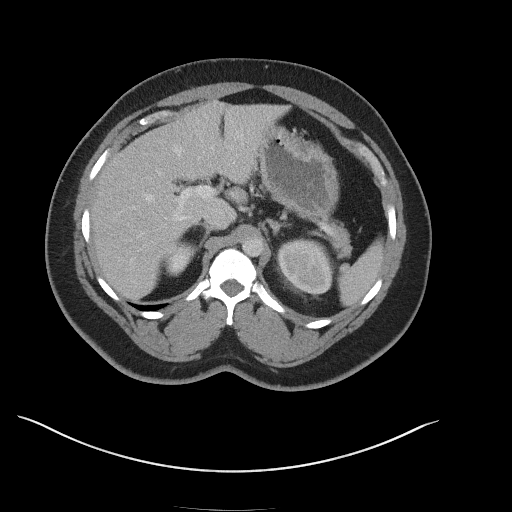
[im 79/93  soft-tissue]
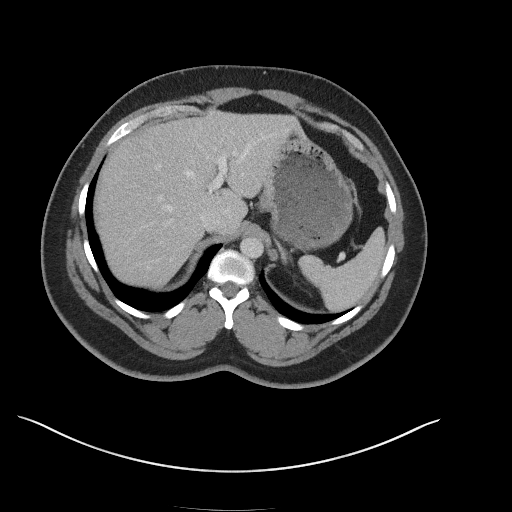
[im 88/93  soft-tissue]
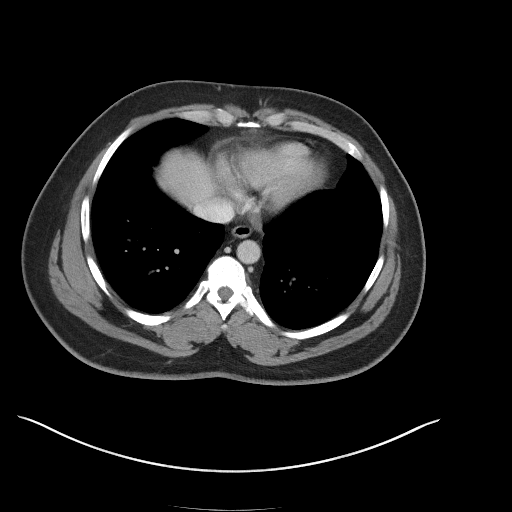

[Series 5: coronal st · coronal · 0.80mm/px · 3 of 121 slices shown]
[im 41/121  soft-tissue]
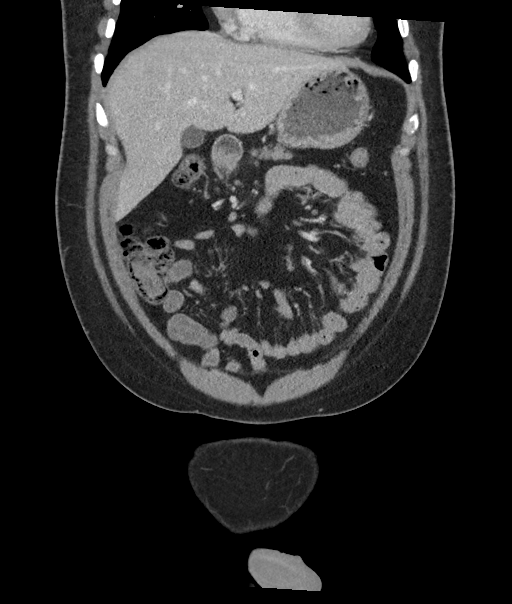
[im 54/121  soft-tissue]
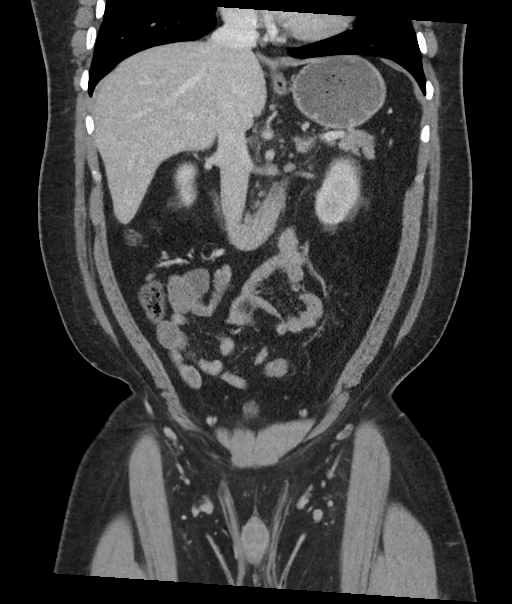
[im 67/121  soft-tissue]
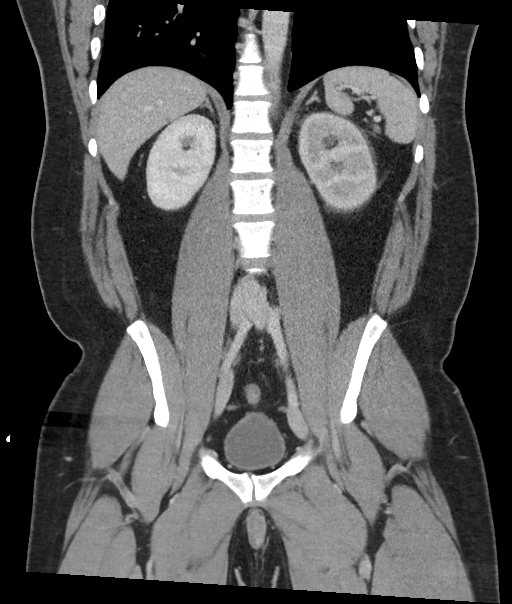

[16 of 46 positions shown; findings below may reference images not displayed]

FINDINGS: Lower chest: No acute pleural or parenchymal lung disease.

Hepatobiliary: No focal liver abnormality is seen. No gallstones,
gallbladder wall thickening, or biliary dilatation.

Pancreas: Unremarkable. No pancreatic ductal dilatation or
surrounding inflammatory changes.

Spleen: Normal in size without focal abnormality.

Adrenals/Urinary Tract: There is mild to moderate left-sided
obstructive uropathy due to a 2 mm obstructing left UVJ calculus,
reference image 70/2. There is left renal edema with delayed
nephrogram. Perinephric fat stranding may reflect superinfection.

The right kidney enhances normally. The adrenals and bladder are
unremarkable.

Stomach/Bowel: No bowel obstruction or ileus. Normal appendix right
lower quadrant. No bowel wall thickening or inflammatory change.

Vascular/Lymphatic: No significant vascular findings are present. No
enlarged abdominal or pelvic lymph nodes.

Reproductive: Prostate is unremarkable.

Other: No free fluid or free intraperitoneal gas. No abdominal wall
hernia.

Musculoskeletal: No acute or destructive bony lesions. Reconstructed
images demonstrate no additional findings.
IMPRESSION: 1. Left-sided obstructive uropathy due to a 2 mm obstructing left
UVJ calculus. Perinephric fat stranding of the left kidney could
reflect superinfection. Please correlate with urinalysis.

## 2022-01-01 ENCOUNTER — Other Ambulatory Visit: Payer: Self-pay | Admitting: Family Medicine

## 2022-01-01 DIAGNOSIS — N644 Mastodynia: Secondary | ICD-10-CM

## 2022-01-04 ENCOUNTER — Other Ambulatory Visit: Payer: Self-pay | Admitting: Family Medicine

## 2022-01-04 DIAGNOSIS — N644 Mastodynia: Secondary | ICD-10-CM

## 2022-02-13 ENCOUNTER — Ambulatory Visit
Admission: RE | Admit: 2022-02-13 | Discharge: 2022-02-13 | Disposition: A | Discharge status: Home / Self Care | Payer: BC Managed Care – PPO | Source: Ambulatory Visit | Attending: Family Medicine | Admitting: Family Medicine

## 2022-02-13 ENCOUNTER — Ambulatory Visit: Payer: BC Managed Care – PPO

## 2022-02-13 DIAGNOSIS — N644 Mastodynia: Secondary | ICD-10-CM

## 2022-08-29 DIAGNOSIS — R5383 Other fatigue: Secondary | ICD-10-CM | POA: Insufficient documentation

## 2022-08-29 DIAGNOSIS — R4189 Other symptoms and signs involving cognitive functions and awareness: Secondary | ICD-10-CM | POA: Insufficient documentation

## 2022-09-03 ENCOUNTER — Ambulatory Visit (INDEPENDENT_AMBULATORY_CARE_PROVIDER_SITE_OTHER): Payer: BC Managed Care – PPO | Admitting: Neurology

## 2022-09-03 ENCOUNTER — Encounter: Payer: Self-pay | Admitting: Neurology

## 2022-09-03 VITALS — BP 124/88 | HR 85 | Ht 65.0 in | Wt 186.0 lb

## 2022-09-03 DIAGNOSIS — R29898 Other symptoms and signs involving the musculoskeletal system: Secondary | ICD-10-CM

## 2022-09-03 DIAGNOSIS — R531 Weakness: Secondary | ICD-10-CM | POA: Diagnosis not present

## 2022-09-03 DIAGNOSIS — R4189 Other symptoms and signs involving cognitive functions and awareness: Secondary | ICD-10-CM | POA: Diagnosis not present

## 2022-09-03 DIAGNOSIS — R42 Dizziness and giddiness: Secondary | ICD-10-CM

## 2022-09-03 DIAGNOSIS — R292 Abnormal reflex: Secondary | ICD-10-CM

## 2022-09-03 NOTE — Patient Instructions (Addendum)
MRI Brain with and without contrast  MRI Cervical spine with and without contrast  I will contact you to go over the results  Continue to follow up with PCP  Return if worse

## 2022-09-03 NOTE — Progress Notes (Signed)
[facility]  PATIENT: Joseph Lambert DOB: [birth year]  REQUESTING CLINICIAN: Knox Royalty, MD HISTORY FROM: Patient and spouse  REASON FOR VISIT: Brain fog, generalized weakness, headaches   HISTORICAL  CHIEF COMPLAINT:  Chief Complaint  Patient presents with   New Patient (Initial Visit)    Rm 12, wife present  Dizziness: 3-4 days of dizziness, nausea, fatigue, brain fog that gets worse before it gets better since September of 2023    HISTORY OF PRESENT ILLNESS:  This is a 42 year old gentleman with past medical history of sleep apnea, hypertension, who is presenting with episodes of confusion, fatigue, dizziness, blurred vision and brain fog.  Patient reports these episodes started in September of last year, on average he will have 1 event every month lasting up to 4 days.  This usually start as a build up of symptoms. He will feel tired, sometimes fatigue, dizziness and symptom intensity will go up to the point that he cannot work and have to take a day off and sleep all day, some days, he cannot even get out of bed.  He reports every single day he will have at least 1 symptoms either brain fog or blurry vision but at one point when the symptoms are severe he will have almost all the symptoms together that we will prompt him to stop his work and to rest.   He does have a history of sleep apnea, he is not consistent with his CPAP machine because he is having trouble with the mask.  He reports on nights when he used the CPAP machine he is feeling well the next day.  He denies any difficulty with the heat, denies any trouble after taking a hot shower.  Denies any family history of MS.   OTHER MEDICAL CONDITIONS: Sleep apnea, Hypertension    REVIEW OF SYSTEMS: Full 14 system review of systems performed and negative with exception of: As noted in the HPI   ALLERGIES: Allergies  Allergen Reactions   Penicillins Anaphylaxis    HOME MEDICATIONS: Outpatient  Medications Prior to Visit  Medication Sig Dispense Refill   amLODipine (NORVASC) 10 MG tablet Take 10 mg by mouth daily.     cetirizine (ZYRTEC) 10 MG chewable tablet Chew 10 mg by mouth daily as needed for allergies.     Coenzyme Q10 200 MG capsule Take 200 mg by mouth daily.     ergocalciferol (VITAMIN D2) 1.25 MG (50000 UT) capsule Take 50,000 Units by mouth once a week.     ibuprofen (ADVIL) 200 MG tablet Take 200 mg by mouth every 6 (six) hours as needed.     No facility-administered medications prior to visit.    PAST MEDICAL HISTORY: Past Medical History:  Diagnosis Date   Allergy As a baby   Penicillin    PAST SURGICAL HISTORY: History reviewed. No pertinent surgical history.  FAMILY HISTORY: Family History  Problem Relation Age of Onset   Arthritis Mother    Thyroid disease Mother    Kidney disease Maternal Aunt        CKD with dialysis   Arthritis Maternal Grandmother    Diabetes Maternal Grandmother    Hypertension Maternal Grandfather    Heart disease Paternal Grandfather    Hypertension Paternal Grandfather    Stroke Paternal Grandfather 77   Lupus Cousin    Sickle cell anemia Cousin    Coronary artery disease Neg Hx    Cancer - Colon Neg Hx    Prostate cancer Neg Hx  SOCIAL HISTORY: Social History   Socioeconomic History   Marital status: Married    Spouse name: Not on file   Number of children: Not on file   Years of education: Not on file   Highest education level: Not on file  Occupational History   Not on file  Tobacco Use   Smoking status: Never   Smokeless tobacco: Never  Vaping Use   Vaping Use: Never used  Substance and Sexual Activity   Alcohol use: Yes    Alcohol/week: 1.0 standard drink of alcohol    Types: 1 Cans of beer per week    Comment: once a month, 2beers   Drug use: Never   Sexual activity: Yes    Birth control/protection: None  Other Topics Concern   Not on file  Social History Narrative   Not on file    Social Determinants of Health   Financial Resource Strain: Not on file  Food Insecurity: Not on file  Transportation Needs: Not on file  Physical Activity: Not on file  Stress: Not on file  Social Connections: Not on file  Intimate Partner Violence: Not on file    PHYSICAL EXAM  GENERAL EXAM/CONSTITUTIONAL: Vitals:  Vitals:   09/03/22 0830  BP: 124/88  Pulse: 85  Weight: 186 lb (84.4 kg)  Height: 5\' 5"  (1.651 m)   Body mass index is 30.95 kg/m. Wt Readings from Last 3 Encounters:  09/03/22 186 lb (84.4 kg)  03/07/21 179 lb (81.2 kg)  02/08/20 175 lb (79.4 kg)   Patient is in no distress; well developed, nourished and groomed; neck is supple  MUSCULOSKELETAL: Gait, strength, tone, movements noted in Neurologic exam below  NEUROLOGIC: MENTAL STATUS:      No data to display         awake, alert, oriented to person, place and time recent and remote memory intact normal attention and concentration language fluent, comprehension intact, naming intact fund of knowledge appropriate  CRANIAL NERVE:  2nd, 3rd, 4th, 6th - Visual fields full to confrontation, extraocular muscles intact, no nystagmus 5th - facial sensation symmetric 7th - facial strength symmetric 8th - hearing intact 9th - palate elevates symmetrically, uvula midline 11th - shoulder shrug symmetric 12th - tongue protrusion midline  MOTOR:  normal bulk and tone, full strength in the BUE, BLE except for the LLE 5-/5  SENSORY:  normal and symmetric to light touch  COORDINATION:  finger-nose-finger, fine finger movements normal  REFLEXES:  deep tendon reflexes present and brisk while the left patellar is 3+   GAIT/STATION:  normal   DIAGNOSTIC DATA (LABS, IMAGING, TESTING) - I reviewed patient records, labs, notes, testing and imaging myself where available.  Lab Results  Component Value Date   WBC 9.1 08/20/2020   HGB 15.2 08/20/2020   HCT 48.1 08/20/2020   MCV 83.4 08/20/2020    PLT 307 08/20/2020      Component Value Date/Time   NA 138 08/20/2020 1647   K 4.0 08/20/2020 1647   CL 104 08/20/2020 1647   CO2 28 08/20/2020 1647   GLUCOSE 95 08/20/2020 1647   BUN 14 08/20/2020 1647   CREATININE 1.69 (H) 08/20/2020 1647   CALCIUM 9.2 08/20/2020 1647   PROT 7.4 08/20/2020 1647   ALBUMIN 4.0 08/20/2020 1647   AST 22 08/20/2020 1647   ALT 35 08/20/2020 1647   ALKPHOS 72 08/20/2020 1647   BILITOT 0.4 08/20/2020 1647   GFRNONAA 53 (L) 08/20/2020 1647   No results found for: "  CHOL", "HDL", "LDLCALC", "LDLDIRECT", "TRIG", "CHOLHDL" No results found for: "HGBA1C" No results found for: "VITAMINB12" No results found for: "TSH"    ASSESSMENT AND PLAN  42 y.o. year old male with history of sleep apnea, hypertension who is presenting with episodes of brain fog, weakness, dizziness, nausea, blurred vision to the point that he is cannot do his job and has to lay down.  These episodes usually happen once a month since September of last year.  I will start by obtaining a MRI brain and MRI cervical spine with and without contrast to rule out demyelinating disease as on exam he was found to have left lower extremity weakness and increased reflexed in the left patella.  If this imaging are normal, plan will be for patient to treat his sleep apnea.  He will need to follow-up with his sleep doctor, get the best CPAP mask and started using his CPAP consistently.  This was explained clearly to the patient and wife and they are comfortable with plans.  I will contact them to discuss the MRI results and advised him to contact to follow-up with the PCP and return if worse.    1. Brain fog   2. Weakness   3. Dizziness   4. Left leg weakness   5. Abnormal reflexes of lower extremity      Patient Instructions  MRI Brain with and without contrast  MRI Cervical spine with and without contrast  I will contact you to go over the results  Continue to follow up with PCP  Return if  worse   Orders Placed This Encounter  Procedures   MR BRAIN W WO CONTRAST   MR CERVICAL SPINE W WO CONTRAST    No orders of the defined types were placed in this encounter.   Return if symptoms worsen or fail to improve.   Windell Norfolk, MD 09/03/2022, 10:52 AM  [facility] [address] [place], [place] [address] [phone]

## 2022-09-17 ENCOUNTER — Ambulatory Visit (INDEPENDENT_AMBULATORY_CARE_PROVIDER_SITE_OTHER): Payer: BC Managed Care – PPO

## 2022-09-17 DIAGNOSIS — R42 Dizziness and giddiness: Secondary | ICD-10-CM | POA: Diagnosis not present

## 2022-09-17 DIAGNOSIS — R292 Abnormal reflex: Secondary | ICD-10-CM

## 2022-09-17 DIAGNOSIS — R4189 Other symptoms and signs involving cognitive functions and awareness: Secondary | ICD-10-CM

## 2022-09-17 DIAGNOSIS — R29898 Other symptoms and signs involving the musculoskeletal system: Secondary | ICD-10-CM

## 2022-09-17 DIAGNOSIS — R531 Weakness: Secondary | ICD-10-CM

## 2022-09-17 MED ORDER — GADOBENATE DIMEGLUMINE 529 MG/ML IV SOLN
15.0000 mL | Freq: Once | INTRAVENOUS | Status: AC | PRN
  Administered 2022-09-17: 15 mL via INTRAVENOUS

## 2023-02-18 ENCOUNTER — Encounter (HOSPITAL_COMMUNITY): Payer: Self-pay

## 2023-02-18 ENCOUNTER — Emergency Department (HOSPITAL_COMMUNITY)
Admission: EM | Admit: 2023-02-18 | Discharge: 2023-02-18 | Disposition: A | Discharge status: Home / Self Care | Payer: BC Managed Care – PPO | Attending: Emergency Medicine | Admitting: Emergency Medicine

## 2023-02-18 ENCOUNTER — Other Ambulatory Visit: Payer: Self-pay

## 2023-02-18 DIAGNOSIS — R09A2 Foreign body sensation, throat: Secondary | ICD-10-CM | POA: Insufficient documentation

## 2023-02-18 LAB — GROUP A STREP BY PCR: Group A Strep by PCR: NOT DETECTED

## 2023-02-18 MED ORDER — ALUM & MAG HYDROXIDE-SIMETH 200-200-20 MG/5ML PO SUSP
30.0000 mL | Freq: Once | ORAL | Status: AC
  Administered 2023-02-18: 30 mL via ORAL
  Filled 2023-02-18: qty 30

## 2023-02-18 MED ORDER — SUCRALFATE 1 G PO TABS: 1.0000 g | ORAL_TABLET | Freq: Three times a day (TID) | ORAL | 0 refills | Status: DC

## 2023-02-18 MED ORDER — ESOMEPRAZOLE MAGNESIUM 40 MG PO CPDR: 40.0000 mg | DELAYED_RELEASE_CAPSULE | Freq: Every day | ORAL | 0 refills | Status: AC

## 2023-02-18 NOTE — ED Notes (Signed)
Pt c/o vomiting three times, brought on after eating sunflower seeds the previous day. Patients throat is normal looking with no redness, swelling, or white patches. Patient has no other complaints.

## 2023-02-18 NOTE — Discharge Instructions (Signed)
It was a pleasure taking care of you today.  You were evaluated for a foreign body sensation in your throat.  As we discussed oftentimes this is just inflammation from the food you ate either being briefly stuck or scratching the esophagus.  You are being treated with Nexium and Carafate which showed help this heal.  If it is not getting better over the next week he should call and schedule appointment with the GI doctor if you have trouble swallowing or breathing or have any other new or worsening symptoms come back to the ER right away.

## 2023-02-18 NOTE — ED Triage Notes (Signed)
Pt complains of a sunflower seed stuck in his throat   Started 2 days ago Now every time he eats his throat hurts and he feels like vomiting

## 2023-02-18 NOTE — ED Provider Notes (Signed)
[place] EMERGENCY DEPARTMENT AT [facility] Provider Note   CSN: [id number] Arrival date & time: 02/18/23  1210     History  Chief Complaint  Patient presents with   Foreign Body    Divyesh Lapiana is a 42 y.o. male.  He has PMH of elevated blood pressure, presents the ER today for complaining of foreign body sensation of the sternal notch for the past couple of days after eating sunflower seeds.  He is been eating and drinking food hoping it would go away but has not gotten any better, denies cough, denies fever, denies difficulty swallowing or breathing.   Foreign Body      Home Medications Prior to Admission medications   Medication Sig Start Date End Date Taking? Authorizing Provider  amLODipine (NORVASC) 10 MG tablet Take 10 mg by mouth daily. 07/11/22   [provider]  cetirizine (ZYRTEC) 10 MG chewable tablet Chew 10 mg by mouth daily as needed for allergies.    [provider]  Coenzyme Q10 200 MG capsule Take 200 mg by mouth daily.    [provider]  ergocalciferol (VITAMIN D2) 1.25 MG (50000 UT) capsule Take 50,000 Units by mouth once a week.    [provider]  ibuprofen (ADVIL) 200 MG tablet Take 200 mg by mouth every 6 (six) hours as needed.    [provider]      Allergies    Penicillins    Review of Systems   Review of Systems  Physical Exam Updated Vital Signs BP 117/89 (BP Location: Right Arm)   Pulse 77   Temp 98 F (36.7 C) (Oral)   Resp 18   Ht 5\' 5"  (1.651 m)   Wt 84.8 kg   SpO2 98%   BMI 31.12 kg/m  Physical Exam Vitals and nursing note reviewed.  Constitutional:      General: He is not in acute distress.    Appearance: He is well-developed.  HENT:     Head: Normocephalic and atraumatic.     Mouth/Throat:     Mouth: Mucous membranes are moist.     Pharynx: Oropharynx is clear. No oropharyngeal exudate or posterior oropharyngeal erythema.  Eyes:     Extraocular Movements:  Extraocular movements intact.     Conjunctiva/sclera: Conjunctivae normal.     Pupils: Pupils are equal, round, and reactive to light.  Cardiovascular:     Rate and Rhythm: Normal rate and regular rhythm.     Heart sounds: No murmur heard. Pulmonary:     Effort: Pulmonary effort is normal. No respiratory distress.     Breath sounds: Normal breath sounds.  Abdominal:     Palpations: Abdomen is soft.     Tenderness: There is no abdominal tenderness.  Musculoskeletal:        General: No swelling. Normal range of motion.     Cervical back: Neck supple.  Skin:    General: Skin is warm and dry.     Capillary Refill: Capillary refill takes less than 2 seconds.  Neurological:     General: No focal deficit present.     Mental Status: He is alert and oriented to person, place, and time.  Psychiatric:        Mood and Affect: Mood normal.     ED Results / Procedures / Treatments   Labs (all labs ordered are listed, but only abnormal results are displayed) Labs Reviewed  GROUP A STREP BY PCR    EKG None  Radiology No results found.  Procedures Procedures    Medications Ordered in ED Medications - No data to display  ED Course/ Medical Decision Making/ A&P                                 Medical Decision Making This patient presents to the ED for concern of foreign body sensation of the throat, this involves an extensive number of treatment options, and is a complaint that carries with it a high risk of complications and morbidity.  The differential diagnosis includes esophagitis, esophageal foreign body, GERD, postnasal drip, other     Lab Tests:  I Ordered, and personally interpreted labs.  The pertinent results include: Negative for strep     Problem List / ED Course / Critical interventions / Medication management  Patient here for foreign body sensation in the throat, no trouble swallowing or breathing he is able to speak in full clear sentences no drooling,  handling his oral secretions well.  Discussed with him this may be a esophageal irritation from several days ago do not feel he has a significant foreign body or needs further imaging or procedures but will treat him symptomatically with a GI cocktail in the ER and sent home with a PPI and Carafate for several days.  Have him follow-up with his PCP and discussed if he is still having the foreign body sensation he can follow-up with GI.  Given strict return precautions. I ordered medication including Gi Cocktail  for fb sensation I have reviewed the patients home medicines and have made adjustments as needed            Final Clinical Impression(s) / ED Diagnoses Final diagnoses:  None    Rx / DC Orders ED Discharge Orders     None         Ma Rings, PA-C 02/18/23 1406    Bethann Berkshire, MD 02/19/23 1733

## 2023-02-26 ENCOUNTER — Encounter (INDEPENDENT_AMBULATORY_CARE_PROVIDER_SITE_OTHER): Payer: Self-pay

## 2023-02-26 ENCOUNTER — Ambulatory Visit (INDEPENDENT_AMBULATORY_CARE_PROVIDER_SITE_OTHER): Payer: BC Managed Care – PPO | Admitting: Gastroenterology

## 2023-03-03 ENCOUNTER — Encounter: Payer: Self-pay | Admitting: Podiatry

## 2023-03-03 ENCOUNTER — Ambulatory Visit (INDEPENDENT_AMBULATORY_CARE_PROVIDER_SITE_OTHER): Payer: BC Managed Care – PPO | Admitting: Podiatry

## 2023-03-03 DIAGNOSIS — M722 Plantar fascial fibromatosis: Secondary | ICD-10-CM | POA: Diagnosis not present

## 2023-03-03 DIAGNOSIS — L603 Nail dystrophy: Secondary | ICD-10-CM | POA: Diagnosis not present

## 2023-03-03 MED ORDER — MELOXICAM 7.5 MG PO TABS
7.5000 mg | ORAL_TABLET | Freq: Every day | ORAL | 0 refills | Status: DC
Start: 2023-03-03 — End: 2023-03-31

## 2023-03-03 NOTE — Patient Instructions (Signed)

## 2023-03-03 NOTE — Progress Notes (Unsigned)
      Chief Complaint  Patient presents with   Nail Problem    Patient states has fungus on toe nails and lifting and has plantar fasciitis history and needs treatment so he can start running again.   HPI: 42 y.o. male presenting today with c/o pain in the bottom of the right heel.  Denies trauma or bruising.  This is affecting his ability to resume running.  Also c/o possible nail fungus in a few toenails.  He's interested in starting treatment for this.  Past Medical History:  Diagnosis Date   Allergy As a baby   Penicillin    History reviewed. No pertinent surgical history.  Allergies  Allergen Reactions   Penicillins Anaphylaxis    Physical Exam: General: The patient is alert and oriented x3 in no acute distress.  Dermatology:  No ecchymosis, erythema, or edema bilateral.  No open lesions.  Bilateral 2nd toenails are 3mm thick, discolored, with subungual debris and distal onycholysis.  Bilateral 5th toenails are dystrophic.  Vascular: Palpable pedal pulses bilaterally. Capillary refill within normal limits.  No appreciable edema.    Neurological: Light touch sensation intact bilateral.  MMT 5/5 to lower extremity bilateral. Negative Tinel's sign with percussion of the posterior tibial nerve on the affected extremity.    Musculoskeletal Exam:  There is pain on palpation of the plantarmedial & plantarcentral aspect of right heel.  No gaps or nodules within the plantar fascia.  Positive Windlass mechanism bilateral.  Antalgic gait noted with first few steps upon standing.  No pain on palpation of achilles tendon bilateral.  Ankle df less than 10 degrees with knee extended b/l.  Assessment/Plan of Care: 1. Plantar fasciitis, right   2. Nail dystrophy     Meds ordered this encounter  Medications   meloxicam (MOBIC) 7.5 MG tablet    Sig: Take 1 tablet (7.5 mg total) by mouth daily.    Dispense:  30 tablet    Refill:  0   -Reviewed etiology of plantar fasciitis with patient.   Discussed treatment options with patient today, including cortisone injection, NSAID course of treatment, stretching exercises, physical therapy, use of night splint, rest, icing the heel, arch supports/orthotics, and supportive shoe gear.    He was fitted for Powerstep inserts.  Rx NSAID sent to his pharmacy.  Stretching exercises printed and dispensed to patient.  Clippings of the 2nd toenail were obtained and sent to [facility] labs for fungal culture  Return in about 4 weeks (around 03/31/2023) for f/u plantar fasciitis & review nail culture.   Clerance Lav, DPM, FACFAS [facility]     [address] [place], [place] [address]                Office [phone]  Fax [phone].

## 2023-03-11 ENCOUNTER — Ambulatory Visit: Payer: BC Managed Care – PPO | Admitting: Podiatry

## 2023-03-17 ENCOUNTER — Other Ambulatory Visit: Payer: Self-pay | Admitting: Podiatry

## 2023-03-23 NOTE — Progress Notes (Signed)
Fungal nail culture was positive for fungus.  Will discuss and review treatment options with patient at upcoming appointment.

## 2023-03-30 ENCOUNTER — Other Ambulatory Visit: Payer: Self-pay | Admitting: Podiatry

## 2023-04-22 ENCOUNTER — Ambulatory Visit (INDEPENDENT_AMBULATORY_CARE_PROVIDER_SITE_OTHER): Payer: BC Managed Care – PPO | Admitting: Podiatry

## 2023-04-22 DIAGNOSIS — M722 Plantar fascial fibromatosis: Secondary | ICD-10-CM | POA: Diagnosis not present

## 2023-04-22 DIAGNOSIS — B351 Tinea unguium: Secondary | ICD-10-CM | POA: Diagnosis not present

## 2023-04-22 MED ORDER — CICLOPIROX 8 % EX SOLN: Freq: Every day | CUTANEOUS | 11 refills | Status: AC

## 2023-04-22 NOTE — Progress Notes (Signed)
      HPI: 42 y.o. male presents today for follow-up of right plantar fasciitis.  He states that he likes the power step inserts and the supportive provides.  He has not tried to resume his running yet.  He notes he still has some pain in the arch of the right foot.  He is also here to have the fungal nail culture results reviewed.  Past Medical History:  Diagnosis Date   Allergy As a baby   Penicillin   No past surgical history on file.  Allergies  Allergen Reactions   Penicillins Anaphylaxis     Physical Exam: General: The patient is alert and oriented x3 in no acute distress.  Dermatology: Skin is warm, dry and supple bilateral lower extremities. Interspaces are clear of maceration and debris.  Bilateral second toenails are 3 mm thick with yellow discoloration, subungual debris, distal onycholysis, pain with compression  Vascular: Palpable pedal pulses bilaterally. Capillary refill within normal limits.  No appreciable edema.  No erythema or calor.  Neurological: Light touch sensation grossly intact bilateral feet.   Musculoskeletal Exam: Tight medial and central plantar fascial bands on palpation on the right foot.  Pain on palpation to the right instep.  Minimal pain on palpation to the heel.  No pain palpation left foot.  Assessment/Plan of Care: 1. Plantar fasciitis, right   2. Dermatophytosis of nail      Meds ordered this encounter  Medications   ciclopirox  (PENLAC ) 8 % solution    Sig: Apply topically at bedtime. Apply thin coat over nails. Apply daily over previous coat. Remove weekly with polish remover.    Dispense:  6.6 mL    Refill:  11   Discussed clinical findings with patient today.  Is fungal nail culture was reviewed today which was positive for nail fungus.  He would like to start on topical ciclopirox  solution to the toenails.  Instructed patient on how to apply this daily and remove weekly.  Will recheck on this in 3 months.  Patient may go ahead and  start trying to gradually resume running.  Advised him to go on YouTube and look up plantar fascia taping and perform self taping prior to his runs.  Recommended continued massage to the arches or formal massage to the area.  Continue with stretching exercises.   Awanda CHARM Imperial, DPM, FACFAS [facility]     [address] [place], [place] [address]                Office [phone]  Fax [phone]

## 2023-07-21 ENCOUNTER — Ambulatory Visit: Payer: BC Managed Care – PPO | Admitting: Podiatry

## 2023-07-28 ENCOUNTER — Ambulatory Visit (INDEPENDENT_AMBULATORY_CARE_PROVIDER_SITE_OTHER): Admitting: Podiatry

## 2023-07-28 DIAGNOSIS — B351 Tinea unguium: Secondary | ICD-10-CM | POA: Diagnosis not present

## 2023-07-28 DIAGNOSIS — M79675 Pain in left toe(s): Secondary | ICD-10-CM

## 2023-07-28 DIAGNOSIS — M79674 Pain in right toe(s): Secondary | ICD-10-CM

## 2023-07-28 NOTE — Progress Notes (Unsigned)
       Subjective:  Patient ID: Joseph Lambert, male    DOB: 1980/07/15,  MRN: [id number]   Cicero Noy presents to clinic today for:  Chief Complaint  Patient presents with   Nail Problem    Nail trim    Patient notes nails are thick, discolored, elongated and painful in shoegear when trying to ambulate.  He is here for follow-up of the nail fungus.  He notes that he was using the ciclopirox gel prescription, but then his wife had a baby, and he has gotten out of his daily routine and has not been using the medication daily.  He is open to trying oral medication at this time due to convenience and time restrictions now.  States he had his blood work done at his family doctor's office approximately 2 weeks ago.  PCP is Knox Royalty, MD.  Past Medical History:  Diagnosis Date   Allergy As a baby   Penicillin    No past surgical history on file.  Allergies  Allergen Reactions   Penicillins Anaphylaxis    Review of Systems: Negative except as noted in the HPI.  Objective:  Amante Fomby is a pleasant 43 y.o. male in NAD. AAO x 3.  Vascular Examination: Capillary refill time is 3-5 seconds to toes bilateral. Palpable pedal pulses b/l LE. Digital hair present b/l.  Skin temperature gradient WNL b/l. No varicosities b/l. No cyanosis noted b/l.   Dermatological Examination: Pedal skin with normal turgor, texture and tone b/l. No open wounds. No interdigital macerations b/l. Toenails x10 are 3mm thick, discolored, dystrophic with subungual debris. There is pain with compression of the nail plates.  They are elongated x10  Assessment/Plan: 1. Pain due to onychomycosis of toenails of both feet    The mycotic toenails were sharply debrided x10 with sterile nail nippers and a power debriding burr to decrease bulk/thickness and length.    Discussed the oral medication, terbinafine, with the patient today.  If he can have his family doctor's office fax [place] the blood work results, I  can take a look at the liver enzyme levels.  If these are within normal limits, will proceed with prescribing the oral terbinafine 250 mg 1 tablet p.o. daily x 90 days.  He can stop using the topical solution if he would like.  Discussed risks involved with oral antifungal medications.    Return in about 3 months (around 10/27/2023) for fungal nail recheck.   Clerance Lav, DPM, FACFAS [facility]     [address] [place], [place] [address]                Office [phone]  Fax [phone]

## 2023-10-27 ENCOUNTER — Ambulatory Visit: Admitting: Podiatry

## 2023-11-10 ENCOUNTER — Ambulatory Visit (INDEPENDENT_AMBULATORY_CARE_PROVIDER_SITE_OTHER): Admitting: Podiatry

## 2023-11-10 DIAGNOSIS — B351 Tinea unguium: Secondary | ICD-10-CM | POA: Diagnosis not present

## 2023-11-10 MED ORDER — TERBINAFINE HCL 250 MG PO TABS: 250.0000 mg | ORAL_TABLET | Freq: Every day | ORAL | 0 refills | Status: AC

## 2023-11-10 NOTE — Progress Notes (Signed)
  Chief Complaint  Patient presents with   Fungal Nail Check    Not consistent with ciclopirox  solution. Has a new baby at home. He is interested in oral medication to treat fungus. Very little improvement in appearance Not diabetic and no anticoag. Labs done by PCP, photo taken of the CMP for provider today.     HPI: 43 y.o. male presents today for follow-up of toenail fungus.  He had been trying the ciclopirox  with little improvement.  He was open to trying the oral medication.  We had been waiting on his liver enzyme results.  He has a hard copy of his most recent blood work that do reveal normal liver enzymes.  He has not had any changes to medications since last seen.  Has no upcoming procedures where this medication could interfere.  Past Medical History:  Diagnosis Date   Allergy As a baby   Penicillin   No past surgical history on file. Allergies  Allergen Reactions   Penicillins Anaphylaxis     Physical Exam: Palpable pedal pulses.  The toenails do still show evidence of mycotic involvement being at least 3 mm thick, with yellow discoloration, subungual debris and distal onycholysis.    Assessment/Plan of Care: 1. Dermatophytosis of nail      Meds ordered this encounter  Medications   terbinafine  (LAMISIL ) 250 MG tablet    Sig: Take 1 tablet (250 mg total) by mouth daily.    Dispense:  90 tablet    Refill:  0   Discussed the oral medication with the patient today.  Discussed risks involved with the medication.  Will go ahead and get him started on terbinafine  250 mg 1 tablet p.o. daily x 90 days.  He was instructed on how to take the medication.  Follow-up in approximately 3 months   Teren Zurcher D. Ovila Lepage, DPM, FACFAS [facility]     [address] [place], [place] [address]                Office [phone]  Fax [phone]

## 2024-02-10 ENCOUNTER — Encounter: Admitting: Podiatry

## 2024-02-10 NOTE — Progress Notes (Signed)
 Patient was a no-show for his scheduled 8:15 AM appointment this morning.
